# Patient Record
Sex: Female | Born: 1975 | Race: Black or African American | Hispanic: No | Marital: Single | Smoking: Current every day smoker
Health system: Southern US, Community
[De-identification: ages and names within clinical notes are randomized; demographics above are authoritative.]

## PROBLEM LIST (undated history)

## (undated) DIAGNOSIS — E669 Obesity, unspecified: Secondary | ICD-10-CM

## (undated) DIAGNOSIS — E7800 Pure hypercholesterolemia, unspecified: Secondary | ICD-10-CM

## (undated) DIAGNOSIS — I10 Essential (primary) hypertension: Secondary | ICD-10-CM

## (undated) DIAGNOSIS — E119 Type 2 diabetes mellitus without complications: Secondary | ICD-10-CM

---

## 2016-09-28 ENCOUNTER — Encounter (HOSPITAL_COMMUNITY): Payer: Self-pay | Admitting: Emergency Medicine

## 2016-09-28 ENCOUNTER — Emergency Department (HOSPITAL_COMMUNITY)
Admission: EM | Admit: 2016-09-28 | Discharge: 2016-09-29 | Disposition: A | Discharge status: Home / Self Care | Payer: Self-pay | Attending: Emergency Medicine | Admitting: Emergency Medicine

## 2016-09-28 ENCOUNTER — Emergency Department (HOSPITAL_COMMUNITY): Payer: Self-pay

## 2016-09-28 DIAGNOSIS — F17200 Nicotine dependence, unspecified, uncomplicated: Secondary | ICD-10-CM | POA: Insufficient documentation

## 2016-09-28 DIAGNOSIS — E119 Type 2 diabetes mellitus without complications: Secondary | ICD-10-CM | POA: Insufficient documentation

## 2016-09-28 DIAGNOSIS — I10 Essential (primary) hypertension: Secondary | ICD-10-CM | POA: Insufficient documentation

## 2016-09-28 DIAGNOSIS — R1011 Right upper quadrant pain: Secondary | ICD-10-CM | POA: Insufficient documentation

## 2016-09-28 DIAGNOSIS — Z7984 Long term (current) use of oral hypoglycemic drugs: Secondary | ICD-10-CM | POA: Insufficient documentation

## 2016-09-28 HISTORY — DX: Pure hypercholesterolemia, unspecified: E78.00

## 2016-09-28 HISTORY — DX: Type 2 diabetes mellitus without complications: E11.9

## 2016-09-28 HISTORY — DX: Essential (primary) hypertension: I10

## 2016-09-28 LAB — CBC WITH DIFFERENTIAL/PLATELET
BASOS ABS: 0.1 10*3/uL (ref 0.0–0.1)
BASOS PCT: 1 %
Eosinophils Absolute: 0.3 10*3/uL (ref 0.0–0.7)
Eosinophils Relative: 3 %
HEMATOCRIT: 39.9 % (ref 36.0–46.0)
HEMOGLOBIN: 13.2 g/dL (ref 12.0–15.0)
LYMPHS PCT: 42 %
Lymphs Abs: 3.3 10*3/uL (ref 0.7–4.0)
MCH: 30.6 pg (ref 26.0–34.0)
MCHC: 33.1 g/dL (ref 30.0–36.0)
MCV: 92.6 fL (ref 78.0–100.0)
MONO ABS: 0.3 10*3/uL (ref 0.1–1.0)
Monocytes Relative: 4 %
NEUTROS ABS: 3.8 10*3/uL (ref 1.7–7.7)
NEUTROS PCT: 50 %
Platelets: 279 10*3/uL (ref 150–400)
RBC: 4.31 MIL/uL (ref 3.87–5.11)
RDW: 14.5 % (ref 11.5–15.5)
WBC: 7.7 10*3/uL (ref 4.0–10.5)

## 2016-09-28 LAB — POC URINE PREG, ED: Preg Test, Ur: NEGATIVE

## 2016-09-28 LAB — COMPREHENSIVE METABOLIC PANEL
ALBUMIN: 3.4 g/dL — AB (ref 3.5–5.0)
ALT: 16 U/L (ref 14–54)
AST: 21 U/L (ref 15–41)
Alkaline Phosphatase: 79 U/L (ref 38–126)
Anion gap: 7 (ref 5–15)
BILIRUBIN TOTAL: 0.4 mg/dL (ref 0.3–1.2)
BUN: 10 mg/dL (ref 6–20)
CO2: 26 mmol/L (ref 22–32)
CREATININE: 0.78 mg/dL (ref 0.44–1.00)
Calcium: 9.3 mg/dL (ref 8.9–10.3)
Chloride: 107 mmol/L (ref 101–111)
GFR calc Af Amer: >60 mL/min (ref >60)
GFR calc non Af Amer: >60 mL/min (ref >60)
GLUCOSE: 146 mg/dL — AB (ref 65–99)
POTASSIUM: 3.6 mmol/L (ref 3.5–5.1)
Sodium: 140 mmol/L (ref 135–145)
TOTAL PROTEIN: 6.6 g/dL (ref 6.5–8.1)

## 2016-09-28 LAB — URINALYSIS, ROUTINE W REFLEX MICROSCOPIC
BILIRUBIN URINE: NEGATIVE
GLUCOSE, UA: NEGATIVE mg/dL
HGB URINE DIPSTICK: NEGATIVE
Ketones, ur: NEGATIVE mg/dL
Leukocytes, UA: NEGATIVE
Nitrite: NEGATIVE
PROTEIN: NEGATIVE mg/dL
SPECIFIC GRAVITY, URINE: 1.019 (ref 1.005–1.030)
pH: 6 (ref 5.0–8.0)

## 2016-09-28 MED ORDER — GI COCKTAIL ~~LOC~~
30.0000 mL | Freq: Once | ORAL | Status: AC
  Administered 2016-09-28: 30 mL via ORAL
  Filled 2016-09-28: qty 30

## 2016-09-28 MED ORDER — HYDROCODONE-ACETAMINOPHEN 5-325 MG PO TABS
1.0000 | ORAL_TABLET | Freq: Once | ORAL | Status: AC
  Administered 2016-09-28: 1 via ORAL
  Filled 2016-09-28: qty 1

## 2016-09-28 NOTE — ED Notes (Signed)
Patient transported to Ultrasound 

## 2016-09-28 NOTE — ED Provider Notes (Signed)
[facility]EMER[facility]ENCY DEPT Provider Note   CSN: [id number] Arrival date & time: 09/28/16  1916     History   Chief Complaint Chief Complaint  Patient presents with  . Abdominal Pain    HPI Yvette Smith is a 40 y.o. female.  The history is provided by the patient and medical records. No language interpreter was used.   Yvette Smith is a 40 y.o. female  with a PMH of DM, HTN, HLD who presents to the Emergency Department complaining of worsening intermittent right upper quadrant abdominal pain 2 weeks. Pain lasts a few minutes then self resolves, occurring multiple times throughout the day. Ibuprofen 400 mg twice a day for pain with little relief. About 3 weeks ago, she states she was constipated and started using laxatives and an enema. Constipation has now resolved. She did have about 4 loose stools total over the last 3-4 days. No blood in the stool. No alleviating or aggravating factors noted. No nausea or vomiting. No fever, back pain, chest pain, dysuria.   Past Medical History:  Diagnosis Date  . Diabetes mellitus without complication (HCC)   . High cholesterol   . Hypertension     There are no active problems to display for this patient.   History reviewed. No pertinent surgical history.  OB History    No data available       Home Medications    Prior to Admission medications   Medication Sig Start Date End Date Taking? Authorizing Provider  ibuprofen (ADVIL,MOTRIN) 200 MG tablet Take 400 mg by mouth 2 (two) times daily as needed (for pain).   Yes Historical Provider, MD  lisinopril (PRINIVIL,ZESTRIL) 40 MG tablet Take 40 mg by mouth daily.   Yes Historical Provider, MD  metFORMIN (GLUCOPHAGE) 500 MG tablet Take 500 mg by mouth 2 (two) times daily with a meal.   Yes Historical Provider, MD  simvastatin (ZOCOR) 10 MG tablet Take 10 mg by mouth at bedtime.   Yes Historical Provider, MD    Family History No family history on file.  Social History Social  History  Substance Use Topics  . Smoking status: Current Every Day Smoker  . Smokeless tobacco: Never Used  . Alcohol use No     Allergies   Bactrim [sulfamethoxazole-trimethoprim]   Review of Systems Review of Systems  Constitutional: Negative for chills and fever.  HENT: Negative for congestion.   Eyes: Negative for visual disturbance.  Respiratory: Negative for cough and shortness of breath.   Cardiovascular: Negative.   Gastrointestinal: Positive for abdominal pain. Negative for blood in stool, nausea and vomiting.  Genitourinary: Negative for dysuria.  Musculoskeletal: Negative for back pain and neck pain.  Skin: Negative for rash.  Neurological: Negative for headaches.     Physical Exam Updated Vital Signs BP 137/79   Pulse 66   Temp 98.2 F (36.8 C) (Oral)   Resp 20   Ht 5\' 4"  (1.626 m)   Wt (!) 140.6 kg   LMP  (LMP Unknown)   SpO2 96%   BMI 53.21 kg/m   Physical Exam  Constitutional: She is oriented to person, place, and time. She appears well-developed and well-nourished. No distress.  HENT:  Head: Normocephalic and atraumatic.  Cardiovascular: Normal rate, regular rhythm, normal heart sounds and intact distal pulses.  Exam reveals no gallop and no friction rub.   No murmur heard. Pulmonary/Chest: Effort normal and breath sounds normal. No respiratory distress. She has no wheezes. She has no rales. She exhibits no  tenderness.  Abdominal: Soft. Bowel sounds are normal. She exhibits no distension. There is tenderness (RUQ). There is no rebound and no guarding.  Musculoskeletal: She exhibits no edema.  Neurological: She is alert and oriented to person, place, and time.  Skin: Skin is warm and dry.  Nursing note and vitals reviewed.    ED Treatments / Results  Labs (all labs ordered are listed, but only abnormal results are displayed) Labs Reviewed  COMPREHENSIVE METABOLIC PANEL - Abnormal; Notable for the following:       Result Value   Glucose, Bld  146 (*)    Albumin 3.4 (*)    All other components within normal limits  CBC WITH DIFFERENTIAL/PLATELET  URINALYSIS, ROUTINE W REFLEX MICROSCOPIC ([facility])  POC URINE PREG, ED    EKG  EKG Interpretation None       Radiology [place] Abdomen Limited Ruq  Result Date: 09/28/2016 CLINICAL DATA:  RIGHT upper quadrant pain for 2 weeks. EXAM: US ABDOMEN LIMITED - RIGHT UPPER QUADRANT COMPARISON:  None. FINDINGS: Gallbladder: Surgically absent. Common bile duct: Diameter: 4 mm Liver: No focal lesion identified. Within normal limits in parenchymal echogenicity. Hepatopetal portal vein. IMPRESSION: Status post cholecystectomy.  No acute RIGHT upper quadrant process. Electronically Signed   By: Awilda Metroourtnay  Bloomer M.D.   On: 09/28/2016 22:42    Procedures Procedures (including critical care time)  Medications Ordered in ED Medications  gi cocktail (Maalox,Lidocaine,Donnatal) (30 mLs Oral Given 09/28/16 2159)  HYDROcodone-acetaminophen (NORCO/VICODIN) 5-325 MG per tablet 1 tablet (1 tablet Oral Given 09/28/16 2354)     Initial Impression / Assessment and Plan / ED Course  I have reviewed the triage vital signs and the nursing notes.  Pertinent labs & imaging results that were available during my care of the patient were reviewed by me and considered in my medical decision making (see chart for details).  Clinical Course   Yvette Smith is a 40 y.o. female who presents to ED for worsening abdominal pain x 2 weeks. On exam, patient has afebrile, nontoxic appearing with a nonsurgical abdomen. She does have tenderness of the right upper quadrant. Labs were reviewed and reassuring. CBC and UA within defined limits. CMP with elevated glucose at 146. Right upper quadrant ultrasound with no acute abnormalities.   Repeat abdominal exam with no peritoneal signs. Pain managed in ED. Patient does not have signs of appendicitis, bowel obstruction, bowel perforation, diverticulitis, PID or ectopic  pregnancy. GI follow up encouraged. Reasons to return to the ED were discussed and all questions were answered. Patient expressed understanding and agrees with plan as dictated above.   Final Clinical Impressions(s) / ED Diagnoses   Final diagnoses:  RUQ pain    New Prescriptions Discharge Medication List as of 09/29/2016 12:00 AM       Chase PicketJaime Pilcher Dequandre Cordova, PA-C 09/29/16 0046    Dione Boozeavid Glick, MD 09/29/16 1555

## 2016-09-28 NOTE — ED Triage Notes (Signed)
Pt st's she started having right upper quad. Pain radiating into back approx 2 weeks ago.  Pt denies nausea or vomiting but does have diarrhea

## 2016-09-28 NOTE — ED Notes (Signed)
Pt back from US

## 2016-09-29 NOTE — Discharge Instructions (Signed)
Please call the GI clinic listed in the morning to schedule a follow-up appointment. Please seek immediate care if you develop any of the following symptoms: The pain does not go away.  You have a fever.  You throw up You pass bloody or black tarry stools.  There is bright red blood in the stool.  There is rectal pain.  You do not seem to be getting better.  You have any questions or concerns.

## 2016-10-04 ENCOUNTER — Emergency Department (HOSPITAL_COMMUNITY)
Admission: EM | Admit: 2016-10-04 | Discharge: 2016-10-05 | Disposition: A | Discharge status: Home / Self Care | Payer: No Typology Code available for payment source | Attending: Emergency Medicine | Admitting: Emergency Medicine

## 2016-10-04 ENCOUNTER — Encounter (HOSPITAL_COMMUNITY): Payer: Self-pay | Admitting: Emergency Medicine

## 2016-10-04 ENCOUNTER — Emergency Department (HOSPITAL_COMMUNITY): Payer: No Typology Code available for payment source

## 2016-10-04 DIAGNOSIS — M546 Pain in thoracic spine: Secondary | ICD-10-CM | POA: Diagnosis not present

## 2016-10-04 DIAGNOSIS — Y939 Activity, unspecified: Secondary | ICD-10-CM | POA: Diagnosis not present

## 2016-10-04 DIAGNOSIS — Y999 Unspecified external cause status: Secondary | ICD-10-CM | POA: Diagnosis not present

## 2016-10-04 DIAGNOSIS — Z7984 Long term (current) use of oral hypoglycemic drugs: Secondary | ICD-10-CM | POA: Diagnosis not present

## 2016-10-04 DIAGNOSIS — S199XXA Unspecified injury of neck, initial encounter: Secondary | ICD-10-CM | POA: Diagnosis not present

## 2016-10-04 DIAGNOSIS — M545 Low back pain, unspecified: Secondary | ICD-10-CM

## 2016-10-04 DIAGNOSIS — Y92410 Unspecified street and highway as the place of occurrence of the external cause: Secondary | ICD-10-CM | POA: Insufficient documentation

## 2016-10-04 DIAGNOSIS — E119 Type 2 diabetes mellitus without complications: Secondary | ICD-10-CM | POA: Diagnosis not present

## 2016-10-04 DIAGNOSIS — I10 Essential (primary) hypertension: Secondary | ICD-10-CM | POA: Diagnosis not present

## 2016-10-04 DIAGNOSIS — M25512 Pain in left shoulder: Secondary | ICD-10-CM

## 2016-10-04 HISTORY — DX: Obesity, unspecified: E66.9

## 2016-10-04 LAB — POC URINE PREG, ED: Preg Test, Ur: NEGATIVE

## 2016-10-04 MED ORDER — IBUPROFEN 400 MG PO TABS
800.0000 mg | ORAL_TABLET | Freq: Once | ORAL | Status: AC
  Administered 2016-10-05: 800 mg via ORAL
  Filled 2016-10-04: qty 2

## 2016-10-04 MED ORDER — CYCLOBENZAPRINE HCL 10 MG PO TABS
10.0000 mg | ORAL_TABLET | Freq: Once | ORAL | Status: AC
  Administered 2016-10-05: 10 mg via ORAL
  Filled 2016-10-04: qty 1
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
## 2016-10-05 MED ORDER — NAPROXEN 500 MG PO TABS: 500.0000 mg | ORAL_TABLET | Freq: Two times a day (BID) | ORAL | 0 refills | Status: AC

## 2016-10-05 MED ORDER — CYCLOBENZAPRINE HCL 10 MG PO TABS: 10.0000 mg | ORAL_TABLET | Freq: Two times a day (BID) | ORAL | 0 refills | Status: AC | PRN

## 2016-10-05 NOTE — ED Notes (Signed)
PA-C to see and assess patient before RN assessment. See EDP note. 

## 2016-10-05 NOTE — ED Notes (Signed)
Pt verbalized understanding discharge instructions and denies any further needs or questions at this time. VS stable, ambulatory and steady gait.   

## 2016-10-05 NOTE — Discharge Instructions (Signed)
Your xrays are negative for any sign of broken bones. Apply ice to affected area. Increase your mobility. Take Naprosyn and Flexeril as needed for pain and inflammation. Follow up with your primary care provider if your symptoms do not improve. Return to the ED if you experience loss of consciousness, blurry vision, vomiting, loss of control of your bowel and bladder, numbness or tingling in your lower extremities. ° °
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
